# Patient Record
Sex: Female | Born: 1955 | Race: White | Hispanic: No | Marital: Married | Smoking: Current some day smoker
Health system: Southern US, Community
[De-identification: ages and names within clinical notes are randomized; demographics above are authoritative.]

## PROBLEM LIST (undated history)

## (undated) DIAGNOSIS — E7800 Pure hypercholesterolemia, unspecified: Secondary | ICD-10-CM

## (undated) DIAGNOSIS — I10 Essential (primary) hypertension: Secondary | ICD-10-CM

## (undated) DIAGNOSIS — M1990 Unspecified osteoarthritis, unspecified site: Secondary | ICD-10-CM

## (undated) DIAGNOSIS — G43909 Migraine, unspecified, not intractable, without status migrainosus: Secondary | ICD-10-CM

## (undated) DIAGNOSIS — M5430 Sciatica, unspecified side: Secondary | ICD-10-CM

## (undated) DIAGNOSIS — H5210 Myopia, unspecified eye: Secondary | ICD-10-CM

## (undated) DIAGNOSIS — H43813 Vitreous degeneration, bilateral: Secondary | ICD-10-CM

## (undated) DIAGNOSIS — M8580 Other specified disorders of bone density and structure, unspecified site: Secondary | ICD-10-CM

## (undated) DIAGNOSIS — F329 Major depressive disorder, single episode, unspecified: Secondary | ICD-10-CM

## (undated) DIAGNOSIS — F32A Depression, unspecified: Secondary | ICD-10-CM

## (undated) DIAGNOSIS — K219 Gastro-esophageal reflux disease without esophagitis: Secondary | ICD-10-CM

## (undated) DIAGNOSIS — M549 Dorsalgia, unspecified: Secondary | ICD-10-CM

## (undated) DIAGNOSIS — J449 Chronic obstructive pulmonary disease, unspecified: Secondary | ICD-10-CM

## (undated) DIAGNOSIS — M810 Age-related osteoporosis without current pathological fracture: Secondary | ICD-10-CM

## (undated) DIAGNOSIS — G8929 Other chronic pain: Secondary | ICD-10-CM

## (undated) DIAGNOSIS — F419 Anxiety disorder, unspecified: Secondary | ICD-10-CM

## (undated) HISTORY — PX: FRACTURE SURGERY: SHX138

## (undated) HISTORY — PX: KNEE SURGERY: SHX244

## (undated) HISTORY — PX: SHOULDER ARTHROSCOPY W/ ROTATOR CUFF REPAIR: SHX2400

## (undated) HISTORY — PX: OTHER SURGICAL HISTORY: SHX169

---

## 2014-03-24 ENCOUNTER — Emergency Department (HOSPITAL_COMMUNITY)
Admission: EM | Admit: 2014-03-24 | Discharge: 2014-03-25 | Disposition: A | Discharge status: Home / Self Care | Payer: Medicaid Other | Attending: Emergency Medicine | Admitting: Emergency Medicine

## 2014-03-24 ENCOUNTER — Encounter (HOSPITAL_COMMUNITY): Payer: Self-pay | Admitting: *Deleted

## 2014-03-24 DIAGNOSIS — W01198A Fall on same level from slipping, tripping and stumbling with subsequent striking against other object, initial encounter: Secondary | ICD-10-CM | POA: Insufficient documentation

## 2014-03-24 DIAGNOSIS — W108XXA Fall (on) (from) other stairs and steps, initial encounter: Secondary | ICD-10-CM | POA: Insufficient documentation

## 2014-03-24 DIAGNOSIS — Y9301 Activity, walking, marching and hiking: Secondary | ICD-10-CM | POA: Diagnosis not present

## 2014-03-24 DIAGNOSIS — W19XXXA Unspecified fall, initial encounter: Secondary | ICD-10-CM

## 2014-03-24 DIAGNOSIS — Y9289 Other specified places as the place of occurrence of the external cause: Secondary | ICD-10-CM | POA: Insufficient documentation

## 2014-03-24 DIAGNOSIS — Z880 Allergy status to penicillin: Secondary | ICD-10-CM | POA: Insufficient documentation

## 2014-03-24 DIAGNOSIS — S00212A Abrasion of left eyelid and periocular area, initial encounter: Secondary | ICD-10-CM | POA: Insufficient documentation

## 2014-03-24 DIAGNOSIS — Y998 Other external cause status: Secondary | ICD-10-CM | POA: Insufficient documentation

## 2014-03-24 DIAGNOSIS — Z7951 Long term (current) use of inhaled steroids: Secondary | ICD-10-CM | POA: Diagnosis not present

## 2014-03-24 DIAGNOSIS — Z79899 Other long term (current) drug therapy: Secondary | ICD-10-CM | POA: Diagnosis not present

## 2014-03-24 DIAGNOSIS — S060X1A Concussion with loss of consciousness of 30 minutes or less, initial encounter: Secondary | ICD-10-CM | POA: Insufficient documentation

## 2014-03-24 DIAGNOSIS — S0990XA Unspecified injury of head, initial encounter: Secondary | ICD-10-CM | POA: Diagnosis present

## 2014-03-24 DIAGNOSIS — I10 Essential (primary) hypertension: Secondary | ICD-10-CM | POA: Insufficient documentation

## 2014-03-24 DIAGNOSIS — Z720 Tobacco use: Secondary | ICD-10-CM | POA: Diagnosis not present

## 2014-03-24 HISTORY — DX: Essential (primary) hypertension: I10

## 2014-03-24 NOTE — ED Notes (Signed)
Pt reports she fell while going down the stairs-fell 15 steps.  Pt reports head, neck, knees, elbows and wrists pain.  +LOC.  At first denies LOC, but family at bedside reports LOC.  Abrasion noted on L side of her face.

## 2014-03-25 ENCOUNTER — Emergency Department (HOSPITAL_COMMUNITY): Payer: Medicaid Other

## 2014-03-25 MED ORDER — ACETAMINOPHEN 500 MG PO TABS
1000.0000 mg | ORAL_TABLET | Freq: Once | ORAL | Status: AC
  Administered 2014-03-25: 1000 mg via ORAL
  Filled 2014-03-25: qty 2

## 2014-03-25 MED ORDER — METOCLOPRAMIDE HCL 10 MG PO TABS
10.0000 mg | ORAL_TABLET | Freq: Once | ORAL | Status: AC
  Administered 2014-03-25: 10 mg via ORAL
  Filled 2014-03-25: qty 1

## 2014-03-25 NOTE — Discharge Instructions (Signed)
Fall Prevention and Home Safety Dana Powers, your CT scan di not show any major injuries. Follow-up with a primary care physician within 3 days for continued management. If symptoms worsen come back to emergency department and medially. Thank you. Falls cause injuries and can affect all age groups. It is possible to prevent falls.  HOW TO PREVENT FALLS  Wear shoes with rubber soles that do not have an opening for your toes.  Keep the inside and outside of your house well lit.  Use night lights throughout your home.  Remove clutter from floors.  Clean up floor spills.  Remove throw rugs or fasten them to the floor with carpet tape.  Do not place electrical cords across pathways.  Put grab bars by your tub, shower, and toilet. Do not use towel bars as grab bars.  Put handrails on both sides of the stairway. Fix loose handrails.  Do not climb on stools or stepladders, if possible.  Do not wax your floors.  Repair uneven or unsafe sidewalks, walkways, or stairs.  Keep items you use a lot within reach.  Be aware of pets.  Keep emergency numbers next to the telephone.  Put smoke detectors in your home and near bedrooms. Ask your doctor what other things you can do to prevent falls. Document Released: 11/06/2008 Document Revised: 07/12/2011 Document Reviewed: 04/12/2011 [facility] Patient Information 2015 [place], [place]. This information is not intended to replace advice given to you by your health care provider. Make sure you discuss any questions you have with your health care provider.  Concussion A concussion is a brain injury. It is caused by:  A hit to the head.  A quick and sudden movement (jolt) of the head or neck. A concussion is usually not life threatening. Even so, it can cause serious problems. If you had a concussion before, you may have concussion-like problems after a hit to your head. HOME CARE General Instructions  Follow your doctor's directions  carefully.  Take medicines only as told by your doctor.  Only take medicines your doctor says are safe.  Do not drink alcohol until your doctor says it is okay. Alcohol and some drugs can slow down healing. They can also put you at risk for further injury.  If you are having trouble remembering things, write them down.  Try to do one thing at a time if you get distracted easily. For example, do not watch TV while making dinner.  Talk to your family members or close friends when making important decisions.  Follow up with your doctor as told.  Watch your symptoms. Tell others to do the same. Serious problems can sometimes happen after a concussion. Older adults are more likely to have these problems.  Tell your teachers, school nurse, school counselor, coach, Event organiserathletic trainer, or work Production designer, theatre/television/filmmanager about your concussion. Tell them about what you can or cannot do. They should watch to see if:  It gets even harder for you to pay attention or concentrate.  It gets even harder for you to remember things or learn new things.  You need more time than normal to finish things.  You become annoyed (irritable) more than before.  You are not able to deal with stress as well.  You have more problems than before.  Rest. Make sure you:  Get plenty of sleep at night.  Go to sleep early.  Go to bed at the same time every day. Try to wake up at the same time.  Rest  during the day.  Take naps when you feel tired.  Limit activities where you have to think a lot or concentrate. These include:  Doing homework.  Doing work related to a job.  Watching TV.  Using the computer. Returning To Your Regular Activities Return to your normal activities slowly, not all at once. You must give your body and brain enough time to heal.   Do not play sports or do other athletic activities until your doctor says it is okay.  Ask your doctor when you can drive, ride a bicycle, or work other vehicles or  machines. Never do these things if you feel dizzy.  Ask your doctor about when you can return to work or school. Preventing Another Concussion It is very important to avoid another brain injury, especially before you have healed. In rare cases, another injury can lead to permanent brain damage, brain swelling, or death. The risk of this is greatest during the first 7-10 days after your injury. Avoid injuries by:   Wearing a seat belt when riding in a car.  Not drinking too much alcohol.  Avoiding activities that could lead to a second concussion (such as contact sports).  Wearing a helmet when doing activities like:  Biking.  Skiing.  Skateboarding.  Skating.  Making your home safer by:  Removing things from the floor or stairways that could make you trip.  Using grab bars in bathrooms and handrails by stairs.  Placing non-slip mats on floors and in bathtubs.  Improve lighting in dark areas. GET HELP IF:  It gets even harder for you to pay attention or concentrate.  It gets even harder for you to remember things or learn new things.  You need more time than normal to finish things.  You become annoyed (irritable) more than before.  You are not able to deal with stress as well.  You have more problems than before.  You have problems keeping your balance.  You are not able to react quickly when you should. Get help if you have any of these problems for more than 2 weeks:   Lasting (chronic) headaches.  Dizziness or trouble balancing.  Feeling sick to your stomach (nausea).  Seeing (vision) problems.  Being affected by noises or light more than normal.  Feeling sad, low, down in the dumps, blue, gloomy, or empty (depressed).  Mood changes (mood swings).  Feeling of fear or nervousness about what may happen (anxiety).  Feeling annoyed.  Memory problems.  Problems concentrating or paying attention.  Sleep problems.  Feeling tired all the time. GET  HELP RIGHT AWAY IF:   You have bad headaches or your headaches get worse.  You have weakness (even if it is in one hand, leg, or part of the face).  You have loss of feeling (numbness).  You feel off balance.  You keep throwing up (vomiting).  You feel tired.  One black center of your eye (pupil) is larger than the other.  You twitch or shake violently (convulse).  Your speech is not clear (slurred).  You are more confused, easily angered (agitated), or annoyed than before.  You have more trouble resting than before.  You are unable to recognize people or places.  You have neck pain.  It is difficult to wake you up.  You have unusual behavior changes.  You pass out (lose consciousness). MAKE SURE YOU:   Understand these instructions.  Will watch your condition.  Will get help right away if you  are not doing well or get worse. Document Released: 12/29/2008 Document Revised: 05/27/2013 Document Reviewed: 08/02/2012 [facility] Patient Information 2015 [place], [place]. This information is not intended to replace advice given to you by your health care provider. Make sure you discuss any questions you have with your health care provider.

## 2014-03-25 NOTE — ED Provider Notes (Signed)
CSN: [id number]     Arrival date & time 03/24/14  2323 History   First MD Initiated Contact with Patient 03/25/14 0126     Chief Complaint  Patient presents with  . Fall  . Neck Injury     (Consider location/radiation/quality/duration/timing/severity/associated sxs/prior Treatment) HPI  Dana Powers is a 59 y.o. female with past medical history of hypertension presenting today after a fall. Patient states this occurred 35 minutes prior to arrival. She was walking down the steps and the railing slipped, she fell face forward, hit her head and has LOC.  She states this was a mechanical fall and denies any prodromal symptoms.  She is currently complaining of pain on her forehead, left infraorbital area, and paraspinal C-spine area. She has not taken anything for pain currently, patient has no further complaints.  10 Systems reviewed and are negative for acute change except as noted in the HPI.    Past Medical History  Diagnosis Date  . Hypertension    Past Surgical History  Procedure Laterality Date  . Neck fusion    . Shoulder arthroscopy w/ rotator cuff repair    . Fracture surgery      t11  . Degenerative disc disease     No family history on file. History  Substance Use Topics  . Smoking status: Current Every Day Smoker  . Smokeless tobacco: Not on file  . Alcohol Use: No   OB History    No data available     Review of Systems    Allergies  Robaxin; Crab; Penicillins; Sulfa antibiotics; Toradol; and Tramadol  Home Medications   Prior to Admission medications   Medication Sig Start Date End Date Taking? Authorizing Provider  albuterol (PROVENTIL HFA;VENTOLIN HFA) 108 (90 BASE) MCG/ACT inhaler Inhale 1 puff into the lungs every 6 (six) hours as needed for wheezing or shortness of breath.   Yes Historical Provider, MD  ALPRAZolam Prudy Feeler) 1 MG tablet Take 1 mg by mouth 3 (three) times daily as needed for anxiety.   Yes Historical Provider, MD  atomoxetine (STRATTERA)  60 MG capsule Take 60 mg by mouth daily.   Yes Historical Provider, MD  beclomethasone (QVAR) 80 MCG/ACT inhaler Inhale 1 puff into the lungs 2 (two) times daily.   Yes Historical Provider, MD  cyclobenzaprine (FLEXERIL) 10 MG tablet Take 10 mg by mouth 3 (three) times daily as needed for muscle spasms.   Yes Historical Provider, MD  ezetimibe (ZETIA) 10 MG tablet Take 10 mg by mouth daily.   Yes Historical Provider, MD  HYDROcodone-acetaminophen (NORCO) 10-325 MG per tablet Take 1 tablet by mouth every 6 (six) hours as needed.   Yes Historical Provider, MD  metoprolol (LOPRESSOR) 50 MG tablet Take 50 mg by mouth 2 (two) times daily.   Yes Historical Provider, MD  nicotine (NICODERM CQ - DOSED IN MG/24 HOURS) 14 mg/24hr patch Place 14 mg onto the skin daily.   Yes Historical Provider, MD  ranitidine (ZANTAC) 300 MG tablet Take 300 mg by mouth at bedtime.   Yes Historical Provider, MD  sertraline (ZOLOFT) 100 MG tablet Take 100 mg by mouth daily.   Yes Historical Provider, MD  simvastatin (ZOCOR) 40 MG tablet Take 40 mg by mouth daily.   Yes Historical Provider, MD   BP 135/72 mmHg  Pulse 87  Temp(Src) 98.2 F (36.8 C) (Oral)  Resp 16  Ht  (1.626 m)  Wt 141 lb (63.957 kg)  BMI 24.19 kg/m2  SpO2 100%  Physical Exam  Constitutional: She is oriented to person, place, and time. She appears well-developed and well-nourished. No distress.  HENT:  Head: Normocephalic and atraumatic.  Nose: Nose normal.  Mouth/Throat: Oropharynx is clear and moist. No oropharyngeal exudate.  Linear abrasion to left infraorbital area.  Eyes: Conjunctivae and EOM are normal. Pupils are equal, round, and reactive to light. No scleral icterus.  Neck: Normal range of motion. Neck supple. No JVD present. No tracheal deviation present. No thyromegaly present.  No C-spine tenderness, tenderness to palpation of paraspinal muscles.  Cardiovascular: Normal rate, regular rhythm and normal heart sounds.  Exam reveals no  gallop and no friction rub.   No murmur heard. Pulmonary/Chest: Effort normal and breath sounds normal. No respiratory distress. She has no wheezes. She exhibits no tenderness.  Abdominal: Soft. Bowel sounds are normal. She exhibits no distension and no mass. There is no tenderness. There is no rebound and no guarding.  Musculoskeletal: Normal range of motion. She exhibits no edema or tenderness.  Lymphadenopathy:    She has no cervical adenopathy.  Neurological: She is alert and oriented to person, place, and time. No cranial nerve deficit. She exhibits normal muscle tone.  Normal strength and sensation 4 extremities, normal cerebellar testing  Skin: Skin is warm and dry. No rash noted. No erythema. No pallor.  Nursing note and vitals reviewed.   ED Course  Procedures (including critical care time) Labs Review Labs Reviewed - No data to display  Imaging Review Ct Head Wo Contrast  03/25/2014   CLINICAL DATA:  Status post fall going down stairs. Fell down 15 steps. Head pain, left-sided facial abrasion and loss of consciousness. Initial encounter.  EXAM: CT HEAD WITHOUT CONTRAST  CT MAXILLOFACIAL WITHOUT CONTRAST  TECHNIQUE: Multidetector CT imaging of the head and maxillofacial structures were performed using the standard protocol without intravenous contrast. Multiplanar CT image reconstructions of the maxillofacial structures were also generated.  COMPARISON:  None.  FINDINGS: CT HEAD FINDINGS  There is no evidence of acute infarction, mass lesion, or intra- or extra-axial hemorrhage on CT.  The posterior fossa, including the cerebellum, brainstem and fourth ventricle, is within normal limits. The third and lateral ventricles, and basal ganglia are unremarkable in appearance. The cerebral hemispheres are symmetric in appearance, with normal gray-white differentiation. No mass effect or midline shift is seen.  There is no evidence of fracture; visualized osseous structures are unremarkable in  appearance. The orbits are within normal limits. The paranasal sinuses and mastoid air cells are well-aerated. No significant soft tissue abnormalities are seen.  CT MAXILLOFACIAL FINDINGS  There is no evidence of fracture or dislocation. The maxilla and mandible appear intact. The nasal bone is unremarkable in appearance. There is chronic absence of the dentition. Anterior cervical spinal fusion hardware is partially imaged, extending inferiorly from C5.  The orbits are intact bilaterally. The visualized paranasal sinuses and mastoid air cells are well-aerated.  No significant soft tissue abnormalities are seen. The known soft tissue abrasion along the left side of the face is not well characterized on CT. The parapharyngeal fat planes are preserved. The nasopharynx, oropharynx and hypopharynx are unremarkable in appearance. The visualized portions of the valleculae and piriform sinuses are grossly unremarkable.  The parotid and submandibular glands are within normal limits. No cervical lymphadenopathy is seen.  IMPRESSION: 1. No evidence of traumatic intracranial injury or fracture. 2. No evidence of fracture or dislocation with regard to the maxillofacial structures.   Electronically Signed   By: Leotis ShamesJeffery  Chang M.D.   On: 03/25/2014 03:38   Ct Maxillofacial Wo Cm  03/25/2014   CLINICAL DATA:  Status post fall going down stairs. Fell down 15 steps. Head pain, left-sided facial abrasion and loss of consciousness. Initial encounter.  EXAM: CT HEAD WITHOUT CONTRAST  CT MAXILLOFACIAL WITHOUT CONTRAST  TECHNIQUE: Multidetector CT imaging of the head and maxillofacial structures were performed using the standard protocol without intravenous contrast. Multiplanar CT image reconstructions of the maxillofacial structures were also generated.  COMPARISON:  None.  FINDINGS: CT HEAD FINDINGS  There is no evidence of acute infarction, mass lesion, or intra- or extra-axial hemorrhage on CT.  The posterior fossa, including the  cerebellum, brainstem and fourth ventricle, is within normal limits. The third and lateral ventricles, and basal ganglia are unremarkable in appearance. The cerebral hemispheres are symmetric in appearance, with normal gray-white differentiation. No mass effect or midline shift is seen.  There is no evidence of fracture; visualized osseous structures are unremarkable in appearance. The orbits are within normal limits. The paranasal sinuses and mastoid air cells are well-aerated. No significant soft tissue abnormalities are seen.  CT MAXILLOFACIAL FINDINGS  There is no evidence of fracture or dislocation. The maxilla and mandible appear intact. The nasal bone is unremarkable in appearance. There is chronic absence of the dentition. Anterior cervical spinal fusion hardware is partially imaged, extending inferiorly from C5.  The orbits are intact bilaterally. The visualized paranasal sinuses and mastoid air cells are well-aerated.  No significant soft tissue abnormalities are seen. The known soft tissue abrasion along the left side of the face is not well characterized on CT. The parapharyngeal fat planes are preserved. The nasopharynx, oropharynx and hypopharynx are unremarkable in appearance. The visualized portions of the valleculae and piriform sinuses are grossly unremarkable.  The parotid and submandibular glands are within normal limits. No cervical lymphadenopathy is seen.  IMPRESSION: 1. No evidence of traumatic intracranial injury or fracture. 2. No evidence of fracture or dislocation with regard to the maxillofacial structures.   Electronically Signed   By: Roanna Raider M.D.   On: 03/25/2014 03:38     EKG Interpretation None      MDM   Final diagnoses:  Fall    Patient presents to emergency department after a fall, she admits to loss of consciousness. Will evaluate with CT scan of head and face to assess for injury. Patient was given Tylenol Reglan for headache.  CT scan does not show any  acute injury.  Patients vital signs remain within her normal limits and she is safe for DC with PCP fu within 3days. Concussion education given, patient advised to take Tylenol Motrin as needed for pain.    Tomasita Crumble, MD 03/25/14 [phone]

## 2015-03-06 ENCOUNTER — Emergency Department (HOSPITAL_COMMUNITY): Payer: Medicaid Other

## 2015-03-06 ENCOUNTER — Encounter (HOSPITAL_COMMUNITY): Payer: Self-pay

## 2015-03-06 ENCOUNTER — Emergency Department (HOSPITAL_COMMUNITY)
Admission: EM | Admit: 2015-03-06 | Discharge: 2015-03-07 | Disposition: A | Discharge status: Home / Self Care | Payer: Medicaid Other | Attending: Emergency Medicine | Admitting: Emergency Medicine

## 2015-03-06 DIAGNOSIS — G43909 Migraine, unspecified, not intractable, without status migrainosus: Secondary | ICD-10-CM | POA: Insufficient documentation

## 2015-03-06 DIAGNOSIS — F410 Panic disorder [episodic paroxysmal anxiety] without agoraphobia: Secondary | ICD-10-CM | POA: Diagnosis not present

## 2015-03-06 DIAGNOSIS — F329 Major depressive disorder, single episode, unspecified: Secondary | ICD-10-CM | POA: Diagnosis not present

## 2015-03-06 DIAGNOSIS — M79622 Pain in left upper arm: Secondary | ICD-10-CM | POA: Diagnosis not present

## 2015-03-06 DIAGNOSIS — J441 Chronic obstructive pulmonary disease with (acute) exacerbation: Secondary | ICD-10-CM | POA: Insufficient documentation

## 2015-03-06 DIAGNOSIS — I10 Essential (primary) hypertension: Secondary | ICD-10-CM | POA: Diagnosis present

## 2015-03-06 DIAGNOSIS — G8929 Other chronic pain: Secondary | ICD-10-CM | POA: Diagnosis not present

## 2015-03-06 DIAGNOSIS — Z79899 Other long term (current) drug therapy: Secondary | ICD-10-CM | POA: Insufficient documentation

## 2015-03-06 DIAGNOSIS — K219 Gastro-esophageal reflux disease without esophagitis: Secondary | ICD-10-CM | POA: Diagnosis not present

## 2015-03-06 DIAGNOSIS — Z791 Long term (current) use of non-steroidal anti-inflammatories (NSAID): Secondary | ICD-10-CM | POA: Diagnosis not present

## 2015-03-06 DIAGNOSIS — E7800 Pure hypercholesterolemia, unspecified: Secondary | ICD-10-CM | POA: Insufficient documentation

## 2015-03-06 DIAGNOSIS — M79661 Pain in right lower leg: Secondary | ICD-10-CM | POA: Diagnosis not present

## 2015-03-06 DIAGNOSIS — M79662 Pain in left lower leg: Secondary | ICD-10-CM | POA: Insufficient documentation

## 2015-03-06 DIAGNOSIS — M1990 Unspecified osteoarthritis, unspecified site: Secondary | ICD-10-CM | POA: Diagnosis not present

## 2015-03-06 DIAGNOSIS — Z880 Allergy status to penicillin: Secondary | ICD-10-CM | POA: Insufficient documentation

## 2015-03-06 DIAGNOSIS — F419 Anxiety disorder, unspecified: Secondary | ICD-10-CM

## 2015-03-06 DIAGNOSIS — F17210 Nicotine dependence, cigarettes, uncomplicated: Secondary | ICD-10-CM | POA: Diagnosis not present

## 2015-03-06 HISTORY — DX: Other chronic pain: G89.29

## 2015-03-06 HISTORY — DX: Gastro-esophageal reflux disease without esophagitis: K21.9

## 2015-03-06 HISTORY — DX: Migraine, unspecified, not intractable, without status migrainosus: G43.909

## 2015-03-06 HISTORY — DX: Unspecified osteoarthritis, unspecified site: M19.90

## 2015-03-06 HISTORY — DX: Myopia, unspecified eye: H52.10

## 2015-03-06 HISTORY — DX: Depression, unspecified: F32.A

## 2015-03-06 HISTORY — DX: Other specified disorders of bone density and structure, unspecified site: M85.80

## 2015-03-06 HISTORY — DX: Sciatica, unspecified side: M54.30

## 2015-03-06 HISTORY — DX: Dorsalgia, unspecified: M54.9

## 2015-03-06 HISTORY — DX: Chronic obstructive pulmonary disease, unspecified: J44.9

## 2015-03-06 HISTORY — DX: Age-related osteoporosis without current pathological fracture: M81.0

## 2015-03-06 HISTORY — DX: Major depressive disorder, single episode, unspecified: F32.9

## 2015-03-06 HISTORY — DX: Anxiety disorder, unspecified: F41.9

## 2015-03-06 HISTORY — DX: Pure hypercholesterolemia, unspecified: E78.00

## 2015-03-06 HISTORY — DX: Vitreous degeneration, bilateral: H43.813

## 2015-03-06 LAB — CBC
HEMATOCRIT: 42.5 % (ref 36.0–46.0)
Hemoglobin: 14.4 g/dL (ref 12.0–15.0)
MCH: 30.4 pg (ref 26.0–34.0)
MCHC: 33.9 g/dL (ref 30.0–36.0)
MCV: 89.7 fL (ref 78.0–100.0)
Platelets: 294 10*3/uL (ref 150–400)
RBC: 4.74 MIL/uL (ref 3.87–5.11)
RDW: 12.8 % (ref 11.5–15.5)
WBC: 16 10*3/uL — ABNORMAL HIGH (ref 4.0–10.5)

## 2015-03-06 LAB — BASIC METABOLIC PANEL
ANION GAP: 10 (ref 5–15)
BUN: 10 mg/dL (ref 6–20)
CHLORIDE: 101 mmol/L (ref 101–111)
CO2: 27 mmol/L (ref 22–32)
Calcium: 9.3 mg/dL (ref 8.9–10.3)
Creatinine, Ser: 0.87 mg/dL (ref 0.44–1.00)
GFR calc Af Amer: >60 mL/min (ref >60)
GFR calc non Af Amer: >60 mL/min (ref >60)
Glucose, Bld: 104 mg/dL — ABNORMAL HIGH (ref 65–99)
POTASSIUM: 4.1 mmol/L (ref 3.5–5.1)
SODIUM: 138 mmol/L (ref 135–145)

## 2015-03-06 LAB — I-STAT TROPONIN, ED: Troponin i, poc: 0.01 ng/mL (ref 0.00–0.08)

## 2015-03-06 NOTE — Progress Notes (Addendum)
Pt medicaid provider confirmed with the office on 03/06/15 as Stoney Bang, PA-C [address] [place] [phone]   Entered in d/c instructions  Stoney Bang Schedule an appointment as soon as possible for a visit on 03/09/2015 This is your assigned Medicaid [place] access doctor If you prefer another contact DSS [phone] DSS assigned your doctor *You may receive a bill if you go to any family Dr not assigned to you [address] [place] [address] [id number] Medicaid [place] Access Covered Patient [place]: [phone] [address] [place], [place] [address] [email or web address] Use this website to assist with understanding your coverage & to renew application As a Medicaid client you MUST contact DSS/SSI each time you change address, move to another [place] county or another state to keep your address updated [place] Medicaid Transportation to Dr appts if you are have full Medicaid: [phone], [phone]

## 2015-03-06 NOTE — ED Provider Notes (Signed)
CSN: [id number]     Arrival date & time 03/06/15  1637 History  By signing my name below, I, Lyndel Safe, attest that this documentation has been prepared under the direction and in the presence of Rolan Bucco, MD. Electronically Signed: Lyndel Safe, ED Scribe. 03/06/2015. 12:07 AM.   Chief Complaint  Patient presents with  . Arm Pain  . Leg Pain  . Hypertension  . Chest Pain    The history is provided by the patient. No language interpreter was used.   HPI Comments: Camren Henthorn is a 60 y.o. female, with a h/o HTN, osteoarthritis, depression, anxiety, and panic attacks, who presents to the Emergency Department complaining of constant, moderate pain to bilateral anterior lower legs that she describes as a soreness onset today. This pain is worse with flexion of feet and ambulating, and pt states she has been walking more than usual over the past week. Pt associates intermittent blurry vision and light-headedness onset today, following a panic attack occurring 1 day ago.   She reports experiencing a severe panic attack yesterday which she notes was significant for bilateral mandible pain, trouble breathing, left upper arm pain, numbness in bilateral hands, and GERD, which is not characteristic of her previous panic attacks. She states during the panic attack she was hypertensive at 198/105 mmHg and tachycardic at 102 bpm. The symptoms of SOB, jaw pain, and GERD have since resolved but pt notes she has been hypertensive and tachycardic throughout the day today. She is prescribed lopressor BID and was unable to take her evening dose tonight. The pt admits to being under increased stress lately due to family verbal altercations and her mother recently being admitted to the hospital. She denies numbness in extremities today, or any chest pain over the course of her symptoms. No other associated symptoms or recent illness. She received a stress test 1 year ago at [facility]; she is  unable to recall the results of this study.   PCP; Stoney Bang, PA-C   Past Medical History  Diagnosis Date  . Hypertension   . Osteoarthritis   . Chronic back pain   . Sciatica   . High cholesterol   . Depression   . Anxiety   . Acid reflux   . COPD (chronic obstructive pulmonary disease) (HCC)   . Migraine   . Osteoporosis   . Osteopenia   . Myopia   . PVD (posterior vitreous detachment), both eyes    Past Surgical History  Procedure Laterality Date  . Neck fusion    . Shoulder arthroscopy w/ rotator cuff repair    . Fracture surgery      t11  . Degenerative disc disease    . Knee surgery     Family History  Problem Relation Age of Onset  . Heart failure Father    Social History  Substance Use Topics  . Smoking status: Current Some Day Smoker -- 0.25 packs/day    Types: Cigarettes  . Smokeless tobacco: Never Used  . Alcohol Use: No   OB History    No data available     Review of Systems  Constitutional: Negative for fever, chills, diaphoresis and fatigue.  HENT: Negative for congestion, rhinorrhea and sneezing.   Eyes: Positive for visual disturbance ( blurry vision).  Respiratory: Negative for cough, chest tightness and shortness of breath.   Cardiovascular: Negative for chest pain and leg swelling.  Gastrointestinal: Negative for nausea, vomiting, abdominal pain, diarrhea and blood in stool.  Genitourinary: Negative for frequency, hematuria, flank pain and difficulty urinating.  Musculoskeletal: Positive for arthralgias ( bilateral anterior lower legs). Negative for back pain and gait problem.  Skin: Negative for rash.  Neurological: Positive for light-headedness. Negative for dizziness, speech difficulty, weakness, numbness and headaches.   Allergies  Robaxin; Crab; Penicillins; Sulfa antibiotics; Toradol; and Tramadol  Home Medications   Prior to Admission medications   Medication Sig Start Date End Date Taking? Authorizing Provider  albuterol  (PROVENTIL HFA;VENTOLIN HFA) 108 (90 BASE) MCG/ACT inhaler Inhale 1 puff into the lungs every 6 (six) hours as needed for wheezing or shortness of breath.   Yes Historical Provider, MD  ALPRAZolam Prudy Feeler) 1 MG tablet Take 1 mg by mouth 3 (three) times daily as needed for anxiety.   Yes Historical Provider, MD  Ascorbic Acid (VITAMIN C) 1000 MG tablet Take 1,000 mg by mouth daily.    Yes Historical Provider, MD  Azelastine HCl 0.15 % SOLN Place 1 spray into the nose daily.    Yes Historical Provider, MD  beclomethasone (QVAR) 80 MCG/ACT inhaler Inhale 1 puff into the lungs 2 (two) times daily.   Yes Historical Provider, MD  Cholecalciferol (VITAMIN D-1000 MAX ST) 1000 units tablet Take 1,000 Units by mouth daily.    Yes Historical Provider, MD  cyclobenzaprine (FLEXERIL) 10 MG tablet Take 10 mg by mouth 3 (three) times daily as needed for muscle spasms.   Yes Historical Provider, MD  ezetimibe (ZETIA) 10 MG tablet Take 10 mg by mouth daily.   Yes Historical Provider, MD  meloxicam (MOBIC) 7.5 MG tablet TAKE 1 TABLET (7.5 MG TOTAL) BY MOUTH 2 TIMES DAILY. 02/27/15  Yes Historical Provider, MD  metoprolol (LOPRESSOR) 50 MG tablet Take 50 mg by mouth 2 (two) times daily.   Yes Historical Provider, MD  nicotine (NICODERM CQ - DOSED IN MG/24 HOURS) 14 mg/24hr patch Place 14 mg onto the skin daily.   Yes Historical Provider, MD  omega-3 acid ethyl esters (LOVAZA) 1 g capsule Take 1 g by mouth daily.   Yes Historical Provider, MD  pantoprazole (PROTONIX) 40 MG tablet TAKE 1 TABLET BY MOUTH EVERY MORNING BEFORE BREAKFAST 02/27/15  Yes Historical Provider, MD  ranitidine (ZANTAC) 300 MG tablet Take 300 mg by mouth at bedtime.   Yes Historical Provider, MD  sertraline (ZOLOFT) 100 MG tablet Take 25 mg by mouth daily.    Yes Historical Provider, MD  simvastatin (ZOCOR) 40 MG tablet Take 40 mg by mouth daily.   Yes Historical Provider, MD  STRATTERA 25 MG capsule Take 25 mg by mouth daily. 02/27/15  Yes Historical  Provider, MD  SUBOXONE 8-2 MG FILM Place 1 strip under the tongue twice daily 02/28/15  Yes Historical Provider, MD  tiotropium (SPIRIVA) 18 MCG inhalation capsule Place 18 mcg into inhaler and inhale daily.    Yes Historical Provider, MD  vitamin E 100 UNIT capsule Take 100 Units by mouth daily.    Yes Historical Provider, MD   BP 159/95 mmHg  Pulse 95  Temp(Src) 98.5 F (36.9 C) (Oral)  Resp 20  SpO2 96% Physical Exam  Constitutional: She is oriented to person, place, and time. She appears well-developed and well-nourished.  HENT:  Head: Normocephalic and atraumatic.  Eyes: Pupils are equal, round, and reactive to light.  Neck: Normal range of motion. Neck supple.  Cardiovascular: Normal rate, regular rhythm and normal heart sounds.   Pulmonary/Chest: Effort normal and breath sounds normal. No respiratory distress. She has no  wheezes. She has no rales. She exhibits no tenderness.  Abdominal: Soft. Bowel sounds are normal. There is no tenderness. There is no rebound and no guarding.  Musculoskeletal: Normal range of motion. She exhibits no edema.  Tenderness to anterior lower leg muscles, no pain to posterior calf, no swelling or discoloration, pedal pulses intact.   Lymphadenopathy:    She has no cervical adenopathy.  Neurological: She is alert and oriented to person, place, and time.  Motor 5/5 in all extremities, sensation grossly intact to light touch in all extremities, finger-nose intact, no pronator drift, cranial nerves intact.   Skin: Skin is warm and dry. No rash noted.  Psychiatric: She has a normal mood and affect.    ED Course  Procedures  DIAGNOSTIC STUDIES: Oxygen Saturation is 96% on RA, adequate by my interpretation.    COORDINATION OF CARE: 11:52 PM Discussed treatment plan with pt at bedside and pt agreed to plan. Discussed unremarkable workup with pt and plan to discharge. She has metoprolol at home and close follow up with her PCP. Pt is agreeable to plan.     Labs Review Results for orders placed or performed during the hospital encounter of 03/06/15  Basic metabolic panel  Result Value Ref Range   Sodium 138 135 - 145 mmol/L   Potassium 4.1 3.5 - 5.1 mmol/L   Chloride 101 101 - 111 mmol/L   CO2 27 22 - 32 mmol/L   Glucose, Bld 104 (H) 65 - 99 mg/dL   BUN 10 6 - 20 mg/dL   Creatinine, Ser 1.61 0.44 - 1.00 mg/dL   Calcium 9.3 8.9 - 09.6 mg/dL   GFR calc non Af Amer >60 >60 mL/min   GFR calc Af Amer >60 >60 mL/min   Anion gap 10 5 - 15  CBC  Result Value Ref Range   WBC 16.0 (H) 4.0 - 10.5 K/uL   RBC 4.74 3.87 - 5.11 MIL/uL   Hemoglobin 14.4 12.0 - 15.0 g/dL   HCT 04.5 40.9 - 81.1 %   MCV 89.7 78.0 - 100.0 fL   MCH 30.4 26.0 - 34.0 pg   MCHC 33.9 30.0 - 36.0 g/dL   RDW 91.4 78.2 - 95.6 %   Platelets 294 150 - 400 K/uL  I-stat troponin, ED (not at [facility], [facility])  Result Value Ref Range   Troponin i, poc 0.01 0.00 - 0.08 ng/mL   Comment 3           Dg Chest 2 View  03/06/2015  CLINICAL DATA:  60 year old female with left arm pain and numbness for 1 day. EXAM: CHEST  2 VIEW COMPARISON:  None. FINDINGS: The cardiomediastinal silhouette is unremarkable. There is no evidence of focal airspace disease, pulmonary edema, suspicious pulmonary nodule/mass, pleural effusion, or pneumothorax. No acute bony abnormalities are identified. Cervical spine surgical hardware noted. IMPRESSION: No active cardiopulmonary disease. Electronically Signed   By: Harmon Pier M.D.   On: 03/06/2015 18:23      Imaging Review Dg Chest 2 View  03/06/2015  CLINICAL DATA:  60 year old female with left arm pain and numbness for 1 day. EXAM: CHEST  2 VIEW COMPARISON:  None. FINDINGS: The cardiomediastinal silhouette is unremarkable. There is no evidence of focal airspace disease, pulmonary edema, suspicious pulmonary nodule/mass, pleural effusion, or pneumothorax. No acute bony abnormalities are identified. Cervical spine surgical hardware noted. IMPRESSION: No active  cardiopulmonary disease. Electronically Signed   By: Harmon Pier M.D.   On: 03/06/2015 18:23  I have personally reviewed and evaluated these images and lab results as part of my medical decision-making.   EKG Interpretation   Date/Time:  Friday March 06 2015 17:40:45 EST Ventricular Rate:  118 PR Interval:  86 QRS Duration: 78 QT Interval:  328 QTC Calculation: 459 R Axis:   75 Text Interpretation:  Sinus tachycardia Borderline repolarization  abnormality Baseline wander in lead(s) V1 No old tracing to compare  Confirmed by Elowen Debruyn  MD, Christon Parada ([id number]) on 03/06/2015 11:52:08 PM      MDM   Final diagnoses:  Anxiety  Essential hypertension    Pt with panic attack yesterday with associated jaw pain and SOB.  No symptoms today.  Her arm pain and bilateral leg pain suggest a musculoskeletal etiology.  No signs of a DVT.  No ischemic changes on EKG or other suggestions of ACS.  PT without symptoms today.  No symptoms that would be more consistent with TIA/CVA.  Her HR is down into 80s.  Her BP is up, but hasn't taken her evening meds.  Will d/c. I advise close follow-up with her PCP on Monday. Return precautions were given.  I personally performed the services described in this documentation, which was scribed in my presence.  The recorded information has been reviewed and considered.    Rolan Bucco, MD 03/07/15 [phone]

## 2015-03-06 NOTE — ED Notes (Signed)
Patient c/o left arm pain and numbness, bilateral leg pain. Patient states she had the same symptoms yesterday along with a panic attack. Patient denies a panic attack today. Patient called her physician and was told to come to the ED. Patient also c/o hypertension. Patient states she has taken her BP med /metropolol once today.

## 2015-03-07 NOTE — Discharge Instructions (Signed)
Hypertension  Hypertension, commonly called high blood pressure, is when the force of blood pumping through your arteries is too strong. Your arteries are the blood vessels that carry blood from your heart throughout your body. A blood pressure reading consists of a higher number over a lower number, such as 110/72. The higher number (systolic) is the pressure inside your arteries when your heart pumps. The lower number (diastolic) is the pressure inside your arteries when your heart relaxes. Ideally you want your blood pressure below 120/80.  Hypertension forces your heart to work harder to pump blood. Your arteries may become narrow or stiff. Having untreated or uncontrolled hypertension can cause heart attack, stroke, kidney disease, and other problems.  RISK FACTORS  Some risk factors for high blood pressure are controllable. Others are not.   Risk factors you cannot control include:   · Race. You may be at higher risk if you are African American.  · Age. Risk increases with age.  · Gender. Men are at higher risk than women before age 45 years. After age 65, women are at higher risk than men.  Risk factors you can control include:  · Not getting enough exercise or physical activity.  · Being overweight.  · Getting too much fat, sugar, calories, or salt in your diet.  · Drinking too much alcohol.  SIGNS AND SYMPTOMS  Hypertension does not usually cause signs or symptoms. Extremely high blood pressure (hypertensive crisis) may cause headache, anxiety, shortness of breath, and nosebleed.  DIAGNOSIS  To check if you have hypertension, your health care provider will measure your blood pressure while you are seated, with your arm held at the level of your heart. It should be measured at least twice using the same arm. Certain conditions can cause a difference in blood pressure between your right and left arms. A blood pressure reading that is higher than normal on one occasion does not mean that you need treatment. If  it is not clear whether you have high blood pressure, you may be asked to return on a different day to have your blood pressure checked again. Or, you may be asked to monitor your blood pressure at home for 1 or more weeks.  TREATMENT  Treating high blood pressure includes making lifestyle changes and possibly taking medicine. Living a healthy lifestyle can help lower high blood pressure. You may need to change some of your habits.  Lifestyle changes may include:  · Following the DASH diet. This diet is high in fruits, vegetables, and whole grains. It is low in salt, red meat, and added sugars.  · Keep your sodium intake below 2,300 mg per day.  · Getting at least 30-45 minutes of aerobic exercise at least 4 times per week.  · Losing weight if necessary.  · Not smoking.  · Limiting alcoholic beverages.  · Learning ways to reduce stress.  Your health care provider may prescribe medicine if lifestyle changes are not enough to get your blood pressure under control, and if one of the following is true:  · You are 18-59 years of age and your systolic blood pressure is above 140.  · You are 60 years of age or older, and your systolic blood pressure is above 150.  · Your diastolic blood pressure is above 90.  · You have diabetes, and your systolic blood pressure is over 140 or your diastolic blood pressure is over 90.  · You have kidney disease and your blood pressure is above   140/90.  · You have heart disease and your blood pressure is above 140/90.  Your personal target blood pressure may vary depending on your medical conditions, your age, and other factors.  HOME CARE INSTRUCTIONS  · Have your blood pressure rechecked as directed by your health care provider.    · Take medicines only as directed by your health care provider. Follow the directions carefully. Blood pressure medicines must be taken as prescribed. The medicine does not work as well when you skip doses. Skipping doses also puts you at risk for  problems.  · Do not smoke.    · Monitor your blood pressure at home as directed by your health care provider.   SEEK MEDICAL CARE IF:   · You think you are having a reaction to medicines taken.  · You have recurrent headaches or feel dizzy.  · You have swelling in your ankles.  · You have trouble with your vision.  SEEK IMMEDIATE MEDICAL CARE IF:  · You develop a severe headache or confusion.  · You have unusual weakness, numbness, or feel faint.  · You have severe chest or abdominal pain.  · You vomit repeatedly.  · You have trouble breathing.  MAKE SURE YOU:   · Understand these instructions.  · Will watch your condition.  · Will get help right away if you are not doing well or get worse.     This information is not intended to replace advice given to you by your health care provider. Make sure you discuss any questions you have with your health care provider.     Document Released: 01/10/2005 Document Revised: 05/27/2014 Document Reviewed: 11/02/2012  Elsevier Interactive Patient Education ©2016 [employer]    Panic Attacks  Panic attacks are sudden, short-lived surges of severe anxiety, fear, or discomfort. They may occur for no reason when you are relaxed, when you are anxious, or when you are sleeping. Panic attacks may occur for a number of reasons:   · Healthy people occasionally have panic attacks in extreme, life-threatening situations, such as war or natural disasters. Normal anxiety is a protective mechanism of the body that helps us react to danger (fight or flight response).  · Panic attacks are often seen with anxiety disorders, such as panic disorder, social anxiety disorder, generalized anxiety disorder, and phobias. Anxiety disorders cause excessive or uncontrollable anxiety. They may interfere with your relationships or other life activities.  · Panic attacks are sometimes seen with other mental illnesses, such as depression and posttraumatic stress disorder.  · Certain medical conditions,  prescription medicines, and drugs of abuse can cause panic attacks.  SYMPTOMS   Panic attacks start suddenly, peak within 20 minutes, and are accompanied by four or more of the following symptoms:  · Pounding heart or fast heart rate (palpitations).  · Sweating.  · Trembling or shaking.  · Shortness of breath or feeling smothered.  · Feeling choked.  · Chest pain or discomfort.  · Nausea or strange feeling in your stomach.  · Dizziness, light-headedness, or feeling like you will faint.  · Chills or hot flushes.  · Numbness or tingling in your lips or hands and feet.  · Feeling that things are not real or feeling that you are not yourself.  · Fear of losing control or going crazy.  · Fear of dying.  Some of these symptoms can mimic serious medical conditions. For example, you may think you are having a heart attack. Although panic attacks can be very scary, they   are not life threatening.  DIAGNOSIS   Panic attacks are diagnosed through an assessment by your health care provider. Your health care provider will ask questions about your symptoms, such as where and when they occurred. Your health care provider will also ask about your medical history and use of alcohol and drugs, including prescription medicines. Your health care provider may order blood tests or other studies to rule out a serious medical condition. Your health care provider may refer you to a mental health professional for further evaluation.  TREATMENT   · Most healthy people who have one or two panic attacks in an extreme, life-threatening situation will not require treatment.  · The treatment for panic attacks associated with anxiety disorders or other mental illness typically involves counseling with a mental health professional, medicine, or a combination of both. Your health care provider will help determine what treatment is best for you.  · Panic attacks due to physical illness usually go away with treatment of the illness. If prescription  medicine is causing panic attacks, talk with your health care provider about stopping the medicine, decreasing the dose, or substituting another medicine.  · Panic attacks due to alcohol or drug abuse go away with abstinence. Some adults need professional help in order to stop drinking or using drugs.  HOME CARE INSTRUCTIONS   · Take all medicines as directed by your health care provider.    · Schedule and attend follow-up visits as directed by your health care provider. It is important to keep all your appointments.  SEEK MEDICAL CARE IF:  · You are not able to take your medicines as prescribed.  · Your symptoms do not improve or get worse.  SEEK IMMEDIATE MEDICAL CARE IF:   · You experience panic attack symptoms that are different than your usual symptoms.  · You have serious thoughts about hurting yourself or others.  · You are taking medicine for panic attacks and have a serious side effect.  MAKE SURE YOU:  · Understand these instructions.  · Will watch your condition.  · Will get help right away if you are not doing well or get worse.     This information is not intended to replace advice given to you by your health care provider. Make sure you discuss any questions you have with your health care provider.     Document Released: 01/10/2005 Document Revised: 01/15/2013 Document Reviewed: 08/24/2012  Elsevier Interactive Patient Education ©2016 [employer]

## 2016-07-28 ENCOUNTER — Encounter (HOSPITAL_COMMUNITY): Payer: Self-pay | Admitting: Emergency Medicine

## 2016-07-28 ENCOUNTER — Emergency Department (HOSPITAL_COMMUNITY)
Admission: EM | Admit: 2016-07-28 | Discharge: 2016-07-29 | Disposition: A | Discharge status: Home / Self Care | Payer: Medicaid Other | Attending: Emergency Medicine | Admitting: Emergency Medicine

## 2016-07-28 DIAGNOSIS — Z79899 Other long term (current) drug therapy: Secondary | ICD-10-CM | POA: Diagnosis not present

## 2016-07-28 DIAGNOSIS — I10 Essential (primary) hypertension: Secondary | ICD-10-CM | POA: Diagnosis not present

## 2016-07-28 DIAGNOSIS — R112 Nausea with vomiting, unspecified: Secondary | ICD-10-CM

## 2016-07-28 DIAGNOSIS — B349 Viral infection, unspecified: Secondary | ICD-10-CM | POA: Diagnosis not present

## 2016-07-28 DIAGNOSIS — Z7951 Long term (current) use of inhaled steroids: Secondary | ICD-10-CM | POA: Diagnosis not present

## 2016-07-28 DIAGNOSIS — J449 Chronic obstructive pulmonary disease, unspecified: Secondary | ICD-10-CM | POA: Diagnosis not present

## 2016-07-28 DIAGNOSIS — F17210 Nicotine dependence, cigarettes, uncomplicated: Secondary | ICD-10-CM | POA: Diagnosis not present

## 2016-07-28 LAB — COMPREHENSIVE METABOLIC PANEL
ALK PHOS: 74 U/L (ref 38–126)
ALT: 12 U/L — ABNORMAL LOW (ref 14–54)
ANION GAP: 11 (ref 5–15)
AST: 21 U/L (ref 15–41)
Albumin: 4.5 g/dL (ref 3.5–5.0)
BILIRUBIN TOTAL: 0.6 mg/dL (ref 0.3–1.2)
BUN: 8 mg/dL (ref 6–20)
CALCIUM: 9.2 mg/dL (ref 8.9–10.3)
CO2: 27 mmol/L (ref 22–32)
Chloride: 100 mmol/L — ABNORMAL LOW (ref 101–111)
Creatinine, Ser: 0.71 mg/dL (ref 0.44–1.00)
Glucose, Bld: 116 mg/dL — ABNORMAL HIGH (ref 65–99)
Potassium: 4.1 mmol/L (ref 3.5–5.1)
SODIUM: 138 mmol/L (ref 135–145)
TOTAL PROTEIN: 8.2 g/dL — AB (ref 6.5–8.1)

## 2016-07-28 LAB — CBC
HCT: 44.9 % (ref 36.0–46.0)
HEMOGLOBIN: 15.5 g/dL — AB (ref 12.0–15.0)
MCH: 31.1 pg (ref 26.0–34.0)
MCHC: 34.5 g/dL (ref 30.0–36.0)
MCV: 90 fL (ref 78.0–100.0)
Platelets: 296 10*3/uL (ref 150–400)
RBC: 4.99 MIL/uL (ref 3.87–5.11)
RDW: 12.6 % (ref 11.5–15.5)
WBC: 7.5 10*3/uL (ref 4.0–10.5)

## 2016-07-28 LAB — LIPASE, BLOOD: Lipase: 21 U/L (ref 11–51)

## 2016-07-28 MED ORDER — PANTOPRAZOLE SODIUM 40 MG IV SOLR
40.0000 mg | Freq: Once | INTRAVENOUS | Status: AC
  Administered 2016-07-28: 40 mg via INTRAVENOUS
  Filled 2016-07-28: qty 40

## 2016-07-28 MED ORDER — SODIUM CHLORIDE 0.9 % IV BOLUS (SEPSIS)
1000.0000 mL | Freq: Once | INTRAVENOUS | Status: AC
  Administered 2016-07-28: 1000 mL via INTRAVENOUS

## 2016-07-28 MED ORDER — DICYCLOMINE HCL 10 MG/ML IM SOLN
20.0000 mg | Freq: Once | INTRAMUSCULAR | Status: AC
  Administered 2016-07-28: 20 mg via INTRAMUSCULAR
  Filled 2016-07-28: qty 2

## 2016-07-28 MED ORDER — ONDANSETRON HCL 4 MG/2ML IJ SOLN
4.0000 mg | Freq: Once | INTRAMUSCULAR | Status: AC
  Administered 2016-07-28: 4 mg via INTRAVENOUS
  Filled 2016-07-28: qty 2

## 2016-07-28 NOTE — ED Triage Notes (Signed)
Pt c/o fever, emesis, headache, periumbilical tenderness x 1.5 weeks. 1 episode of emesis over weekend caused SOB and tingling fingers, which resolved with Pro-Air inhaler. Pt and family had gastroenteritis 07/08/16 to 07/10/16, all recovered but patient's symptoms returned. Pt also reports rash x 3 days to right arm onset after getting Prolia injection.

## 2016-07-28 NOTE — ED Provider Notes (Signed)
[facility]-EMERGENCY DEPT Provider Note   CSN: [id number] Arrival date & time: 07/28/16  1823    History   Chief Complaint Chief Complaint  Patient presents with  . Emesis    HPI Dana Powers is a 61 y.o. female.  61 year old female with a history of hypertension, chronic back pain and sciatica (on chronic suboxone), dyslipidemia, COPD, migraine headaches, and esophageal reflux presents to the emergency department for multiple complaints. She notes 1.5 weeks of her umbilical discomfort associated with sporadic nausea, diarrhea, and vomiting. She has been unable to tolerate fluids or food by mouth today due to emesis. Patient also notes subjective fever characterized by feeling hot with chills. She has had sweats at times as well. She further notes an occipital headache which has been fairly constant. She notes exposure to sick contacts with gastroenteritis 3 weeks ago. She initially became ill shortly after initial contact, but was mildly improving until the week of June 25th. She has been unable to take some of her home medications due to emesis. She states that some of her symptoms may be secondary to withdrawal from her chronic medications. She has been able to take small amounts of her Suboxone.   History very difficult to obtain as patient's thoughts are scattered necessitating multiple moments of redirection. No hx of abdominal surgeries.   The history is provided by the patient and the spouse. No language interpreter was used.  Emesis      Past Medical History:  Diagnosis Date  . Acid reflux   . Anxiety   . Chronic back pain   . COPD (chronic obstructive pulmonary disease) (HCC)   . Depression   . High cholesterol   . Hypertension   . Migraine   . Myopia   . Osteoarthritis   . Osteopenia   . Osteoporosis   . PVD (posterior vitreous detachment), both eyes   . Sciatica     There are no active problems to display for this patient.   Past Surgical History:  Procedure  Laterality Date  . degenerative disc disease    . FRACTURE SURGERY     t11  . KNEE SURGERY    . neck fusion    . SHOULDER ARTHROSCOPY W/ ROTATOR CUFF REPAIR      OB History    No data available       Home Medications    Prior to Admission medications   Medication Sig Start Date End Date Taking? Authorizing Provider  albuterol (PROVENTIL HFA;VENTOLIN HFA) 108 (90 BASE) MCG/ACT inhaler Inhale 1 puff into the lungs every 6 (six) hours as needed for wheezing or shortness of breath.   Yes [provider]  Ascorbic Acid (VITAMIN C) 1000 MG tablet Take 1,000 mg by mouth daily.    Yes [provider]  beclomethasone (QVAR) 80 MCG/ACT inhaler Inhale 1 puff into the lungs 2 (two) times daily.   Yes [provider]  Cholecalciferol (VITAMIN D-1000 MAX ST) 1000 units tablet Take 1,000 Units by mouth daily.    Yes [provider]  cyclobenzaprine (FLEXERIL) 10 MG tablet Take 10 mg by mouth 3 (three) times daily as needed for muscle spasms.   Yes [provider]  denosumab (PROLIA) 60 MG/ML SOLN injection Inject 60 mg into the skin every 6 (six) months. Administer in upper arm, thigh, or abdomen   Yes [provider]  ezetimibe (ZETIA) 10 MG tablet Take 10 mg by mouth daily.   Yes [provider]  meloxicam (MOBIC)  7.5 MG tablet TAKE 1 TABLET (7.5 MG TOTAL) BY MOUTH 2 TIMES DAILY. 02/27/15  Yes [provider]  metoprolol (LOPRESSOR) 50 MG tablet Take 50 mg by mouth 2 (two) times daily.   Yes [provider]  Multiple Vitamins-Minerals (ZINC PO) Take 1 tablet by mouth daily.   Yes [provider]  pantoprazole (PROTONIX) 40 MG tablet TAKE 1 TABLET BY MOUTH EVERY MORNING BEFORE BREAKFAST 02/27/15  Yes [provider]  ranitidine (ZANTAC) 300 MG tablet Take 300 mg by mouth at bedtime.   Yes [provider]  simvastatin (ZOCOR) 40 MG tablet Take 40 mg by mouth daily.   Yes [provider]    SUBOXONE 8-2 MG FILM Place 1 strip under the tongue twice daily 02/28/15  Yes [provider]  tiotropium (SPIRIVA) 18 MCG inhalation capsule Place 18 mcg into inhaler and inhale daily.    Yes [provider]  vitamin E 100 UNIT capsule Take 100 Units by mouth daily.    Yes [provider]  dicyclomine (BENTYL) 20 MG tablet Take 1 tablet (20 mg total) by mouth 2 (two) times daily as needed (abdominal pain/cramping). 07/29/16   Antony Madura, PA-C  promethazine (PHENERGAN) 25 MG tablet Take 1 tablet (25 mg total) by mouth every 6 (six) hours as needed for nausea or vomiting. 07/29/16   Antony Madura, PA-C    Family History Family History  Problem Relation Age of Onset  . Heart failure Father     Social History Social History  Substance Use Topics  . Smoking status: Current Some Day Smoker    Packs/day: 0.25    Types: Cigarettes  . Smokeless tobacco: Never Used  . Alcohol use No     Allergies   Crab [shellfish allergy]; Imitrex [sumatriptan]; Robaxin [methocarbamol]; Penicillins; Sulfa antibiotics; Toradol [ketorolac tromethamine]; and Tramadol   Review of Systems Review of Systems  Gastrointestinal: Positive for vomiting.  Ten systems reviewed and are negative for acute change, except as noted in the HPI.    Physical Exam Updated Vital Signs BP (!) 102/49 (BP Location: Left Arm)   Pulse 75   Temp 97.8 F (36.6 C) (Oral)   Resp 16   SpO2 99%   Physical Exam  Constitutional: She is oriented to person, place, and time. She appears well-developed and well-nourished. No distress.  Nontoxic and in NAD. Sporadic bilious emesis noted.  HENT:  Head: Normocephalic and atraumatic.  Eyes: Conjunctivae and EOM are normal. No scleral icterus.  Neck: Normal range of motion.  Cardiovascular: Normal rate, regular rhythm and intact distal pulses.   Pulmonary/Chest: Effort normal. No respiratory distress. She has no wheezes.  Respirations even and unlabored   Abdominal: Soft. She exhibits no distension and no mass. There is tenderness (generalized).  Musculoskeletal: Normal range of motion.  Neurological: She is alert and oriented to person, place, and time. She exhibits normal muscle tone. Coordination normal.  GCS 15. Patient moving all extremities.  Skin: Skin is warm and dry. No rash noted. She is not diaphoretic. No erythema. No pallor.  Psychiatric: She has a normal mood and affect. Her behavior is normal.  Nursing note and vitals reviewed.    ED Treatments / Results  Labs (all labs ordered are listed, but only abnormal results are displayed) Labs Reviewed  COMPREHENSIVE METABOLIC PANEL - Abnormal; Notable for the following:       Result Value   Chloride 100 (*)    Glucose, Bld 116 (*)  Total Protein 8.2 (*)    ALT 12 (*)    All other components within normal limits  CBC - Abnormal; Notable for the following:    Hemoglobin 15.5 (*)    All other components within normal limits  URINALYSIS, ROUTINE W REFLEX MICROSCOPIC - Abnormal; Notable for the following:    Color, Urine STRAW (*)    Ketones, ur 20 (*)    All other components within normal limits  LIPASE, BLOOD    EKG  EKG Interpretation None       Radiology No results found.  Procedures Procedures (including critical care time)  Medications Ordered in ED Medications  ondansetron (ZOFRAN) injection 4 mg (4 mg Intravenous Given 07/28/16 2229)  sodium chloride 0.9 % bolus 1,000 mL (0 mLs Intravenous Stopped 07/29/16 0025)  pantoprazole (PROTONIX) injection 40 mg (40 mg Intravenous Given 07/28/16 2229)  dicyclomine (BENTYL) injection 20 mg (20 mg Intramuscular Given 07/28/16 2229)  sodium chloride 0.9 % bolus 1,000 mL (0 mLs Intravenous Stopped 07/29/16 0245)  prochlorperazine (COMPAZINE) injection 10 mg (10 mg Intravenous Given 07/29/16 0136)  diphenhydrAMINE (BENADRYL) injection 25 mg (25 mg Intravenous Given 07/29/16 0135)     Initial Impression / Assessment and Plan /  ED Course  I have reviewed the triage vital signs and the nursing notes.  Pertinent labs & imaging results that were available during my care of the patient were reviewed by me and considered in my medical decision making (see chart for details).     Patient with symptoms consistent with viral illness, likely gastroenteritis. Vitals are stable, no fever. No signs of dehydration, tolerating PO fluids > 6 oz. Lungs are clear. No focal abdominal pain. Doubt emergent or surgical etiology such as appendicitis, cholecystitis, pancreatitis, ruptured viscus, UTI, kidney stone, or any other abdominal etiology. Supportive therapy indicated with return if symptoms worsen. Patient discharged in stable condition with no unaddressed concerns.   Vitals:   07/29/16 0025 07/29/16 0141 07/29/16 0143 07/29/16 0234  BP: 118/67  (!) 143/65 (!) 102/49  Pulse: 91  96 75  Resp: 16  16 16   Temp:   97.8 F (36.6 C)   TempSrc:   Oral   SpO2: 98% 98% 97% 99%    Final Clinical Impressions(s) / ED Diagnoses   Final diagnoses:  Viral illness  Non-intractable vomiting with nausea, unspecified vomiting type    New Prescriptions Discharge Medication List as of 07/29/2016  2:26 AM    START taking these medications   Details  dicyclomine (BENTYL) 20 MG tablet Take 1 tablet (20 mg total) by mouth 2 (two) times daily as needed (abdominal pain/cramping)., Starting Fri 07/29/2016, Print    promethazine (PHENERGAN) 25 MG tablet Take 1 tablet (25 mg total) by mouth every 6 (six) hours as needed for nausea or vomiting., Starting Fri 07/29/2016, Print         Antony Madura, PA-C 07/29/16 1610    Pricilla Loveless, MD 07/31/16 [phone]

## 2016-07-29 LAB — URINALYSIS, ROUTINE W REFLEX MICROSCOPIC
BILIRUBIN URINE: NEGATIVE
Glucose, UA: NEGATIVE mg/dL
HGB URINE DIPSTICK: NEGATIVE
Ketones, ur: 20 mg/dL — AB
Leukocytes, UA: NEGATIVE
Nitrite: NEGATIVE
PH: 7 (ref 5.0–8.0)
Protein, ur: NEGATIVE mg/dL
SPECIFIC GRAVITY, URINE: 1.005 (ref 1.005–1.030)

## 2016-07-29 MED ORDER — PROMETHAZINE HCL 25 MG/ML IJ SOLN: 12.5000 mg | Freq: Once | INTRAMUSCULAR | Status: DC

## 2016-07-29 MED ORDER — SODIUM CHLORIDE 0.9 % IV BOLUS (SEPSIS)
1000.0000 mL | Freq: Once | INTRAVENOUS | Status: AC
  Administered 2016-07-29: 1000 mL via INTRAVENOUS

## 2016-07-29 MED ORDER — PROCHLORPERAZINE EDISYLATE 5 MG/ML IJ SOLN
10.0000 mg | Freq: Once | INTRAMUSCULAR | Status: AC
  Administered 2016-07-29: 10 mg via INTRAVENOUS
  Filled 2016-07-29: qty 2

## 2016-07-29 MED ORDER — PROMETHAZINE HCL 25 MG PO TABS: 25.0000 mg | ORAL_TABLET | Freq: Four times a day (QID) | ORAL | 0 refills | Status: AC | PRN

## 2016-07-29 MED ORDER — DICYCLOMINE HCL 20 MG PO TABS: 20.0000 mg | ORAL_TABLET | Freq: Two times a day (BID) | ORAL | 0 refills | Status: AC | PRN

## 2016-07-29 MED ORDER — DIPHENHYDRAMINE HCL 50 MG/ML IJ SOLN
25.0000 mg | Freq: Once | INTRAMUSCULAR | Status: AC
  Administered 2016-07-29: 25 mg via INTRAVENOUS
  Filled 2016-07-29: qty 1

## 2017-04-12 ENCOUNTER — Other Ambulatory Visit: Payer: Self-pay

## 2017-04-12 ENCOUNTER — Emergency Department (HOSPITAL_COMMUNITY)
Admission: EM | Admit: 2017-04-12 | Discharge: 2017-04-12 | Disposition: A | Discharge status: Home / Self Care | Payer: Medicaid Other | Attending: Emergency Medicine | Admitting: Emergency Medicine

## 2017-04-12 ENCOUNTER — Emergency Department (HOSPITAL_COMMUNITY): Payer: Medicaid Other

## 2017-04-12 ENCOUNTER — Encounter (HOSPITAL_COMMUNITY): Payer: Self-pay | Admitting: *Deleted

## 2017-04-12 DIAGNOSIS — M545 Low back pain: Secondary | ICD-10-CM | POA: Insufficient documentation

## 2017-04-12 DIAGNOSIS — J449 Chronic obstructive pulmonary disease, unspecified: Secondary | ICD-10-CM | POA: Insufficient documentation

## 2017-04-12 DIAGNOSIS — F17210 Nicotine dependence, cigarettes, uncomplicated: Secondary | ICD-10-CM | POA: Diagnosis not present

## 2017-04-12 DIAGNOSIS — G8929 Other chronic pain: Secondary | ICD-10-CM | POA: Diagnosis not present

## 2017-04-12 DIAGNOSIS — Z79899 Other long term (current) drug therapy: Secondary | ICD-10-CM | POA: Insufficient documentation

## 2017-04-12 DIAGNOSIS — I10 Essential (primary) hypertension: Secondary | ICD-10-CM | POA: Diagnosis not present

## 2017-04-12 NOTE — ED Notes (Signed)
Provided patient with turkey sandwich.

## 2017-04-12 NOTE — ED Provider Notes (Signed)
[facility] EMERGENCY DEPARTMENT Provider Note   CSN: [id number] Arrival date & time: 04/12/17  1617     History   Chief Complaint Chief Complaint  Patient presents with  . Back Pain    HPI Dana Powers is a 62 y.o. female with past medical history of degenerative disc disease, osteoporosis, hypertension, sciatica, presenting to the ED with worsening chronic low back pain since yesterday.  Patient states she slid off the bed onto the floor onto her bottom, and has been having worsening pain since.  She states she has a history of fractured coccyx 2 times, and is having pain in her tailbone is bilateral lower back. Pain is worse with ambulation and particular moments. States she has sciatica at baseline, without significant worsening in tingling sensation.  Takes hydrocodone and Flexeril at home for pain.  Denies numbness, bowel or bladder incontinence, history of IV drug use.  She attempted to see her orthopedic physician, however he became more specialized into upper extremities and was unable to see her.   The history is provided by the patient.    Past Medical History:  Diagnosis Date  . Acid reflux   . Anxiety   . Chronic back pain   . COPD (chronic obstructive pulmonary disease) (HCC)   . Depression   . High cholesterol   . Hypertension   . Migraine   . Myopia   . Osteoarthritis   . Osteopenia   . Osteoporosis   . PVD (posterior vitreous detachment), both eyes   . Sciatica     There are no active problems to display for this patient.   Past Surgical History:  Procedure Laterality Date  . degenerative disc disease    . FRACTURE SURGERY     t11  . KNEE SURGERY    . neck fusion    . SHOULDER ARTHROSCOPY W/ ROTATOR CUFF REPAIR      OB History    No data available       Home Medications    Prior to Admission medications   Medication Sig Start Date End Date Taking? Authorizing Provider  albuterol (PROVENTIL HFA;VENTOLIN HFA) 108 (90  BASE) MCG/ACT inhaler Inhale 1 puff into the lungs every 6 (six) hours as needed for wheezing or shortness of breath.    [provider]  Ascorbic Acid (VITAMIN C) 1000 MG tablet Take 1,000 mg by mouth daily.     [provider]  beclomethasone (QVAR) 80 MCG/ACT inhaler Inhale 1 puff into the lungs 2 (two) times daily.    [provider]  Cholecalciferol (VITAMIN D-1000 MAX ST) 1000 units tablet Take 1,000 Units by mouth daily.     [provider]  cyclobenzaprine (FLEXERIL) 10 MG tablet Take 10 mg by mouth 3 (three) times daily as needed for muscle spasms.    [provider]  denosumab (PROLIA) 60 MG/ML SOLN injection Inject 60 mg into the skin every 6 (six) months. Administer in upper arm, thigh, or abdomen    [provider]  dicyclomine (BENTYL) 20 MG tablet Take 1 tablet (20 mg total) by mouth 2 (two) times daily as needed (abdominal pain/cramping). 07/29/16   Antony Madura, PA-C  ezetimibe (ZETIA) 10 MG tablet Take 10 mg by mouth daily.    [provider]  meloxicam (MOBIC) 7.5 MG tablet TAKE 1 TABLET (7.5 MG TOTAL) BY MOUTH 2 TIMES DAILY. 02/27/15   [provider]  metoprolol (LOPRESSOR) 50 MG tablet Take 50 mg by mouth  2 (two) times daily.    [provider]  Multiple Vitamins-Minerals (ZINC PO) Take 1 tablet by mouth daily.    [provider]  pantoprazole (PROTONIX) 40 MG tablet TAKE 1 TABLET BY MOUTH EVERY MORNING BEFORE BREAKFAST 02/27/15   [provider]  promethazine (PHENERGAN) 25 MG tablet Take 1 tablet (25 mg total) by mouth every 6 (six) hours as needed for nausea or vomiting. 07/29/16   Antony MaduraHumes, Kelly, PA-C  ranitidine (ZANTAC) 300 MG tablet Take 300 mg by mouth at bedtime.    [provider]  simvastatin (ZOCOR) 40 MG tablet Take 40 mg by mouth daily.    [provider]  SUBOXONE 8-2 MG FILM Place 1 strip under the tongue twice daily 02/28/15   [provider]    tiotropium (SPIRIVA) 18 MCG inhalation capsule Place 18 mcg into inhaler and inhale daily.     [provider]  vitamin E 100 UNIT capsule Take 100 Units by mouth daily.     [provider]    Family History Family History  Problem Relation Age of Onset  . Heart failure Father     Social History Social History   Tobacco Use  . Smoking status: Current Some Day Smoker    Packs/day: 0.25    Types: Cigarettes  . Smokeless tobacco: Never Used  Substance Use Topics  . Alcohol use: No  . Drug use: Yes    Types: Marijuana    Comment: 10 days ago.     Allergies   Crab [shellfish allergy]; Imitrex [sumatriptan]; Robaxin [methocarbamol]; Penicillins; Sulfa antibiotics; Toradol [ketorolac tromethamine]; and Tramadol   Review of Systems Review of Systems  Constitutional: Negative for fever.  Gastrointestinal:       No bowel incontinence  Genitourinary: Negative for difficulty urinating.  Musculoskeletal: Positive for back pain.  Neurological: Negative for weakness and numbness.     Physical Exam Updated Vital Signs BP (!) 142/54 (BP Location: Right Arm)   Pulse 90   Temp 98 F (36.7 C) (Oral)   Resp 16   SpO2 95%   Physical Exam  Constitutional: She appears well-developed and well-nourished.  HENT:  Head: Normocephalic and atraumatic.  Eyes: Conjunctivae are normal.  Cardiovascular: Normal rate, normal heart sounds and intact distal pulses.  Pulmonary/Chest: Effort normal and breath sounds normal.  Musculoskeletal:       Back:  No midline spinal or paraspinal tenderness, no bony step-offs or gross deformities.  Bilateral lower back musculature with tenderness, extending into bilateral gluteal muscles.  Neurological:  Motor:  Normal tone. 5/5 in lower extremities bilaterally including strong and equal dorsiflexion/plantar flexion Sensory: Pinprick and light touch normal in BLE  Deep Tendon Reflexes: 2+ and symmetric in the biceps and  patella Cerebellar: normal heel-to-shin with bilateral lower extremities CV: distal pulses palpable throughout    Psychiatric: She has a normal mood and affect. Her behavior is normal.  Nursing note and vitals reviewed.    ED Treatments / Results  Labs (all labs ordered are listed, but only abnormal results are displayed) Labs Reviewed - No data to display  EKG  EKG Interpretation None       Radiology Dg Lumbar Spine Complete  Result Date: 04/12/2017 CLINICAL DATA:  Mechanical fall with back pain. EXAM: LUMBAR SPINE - COMPLETE 4+ VIEW COMPARISON:  None. FINDINGS: There is no evidence of lumbar spine fracture. Mild facet arthropathy with joint space narrowing and sclerosis is noted at L5-S1. Alignment is normal. Slight disc space  narrowing noted at L2-3 and L3-4. Aortic atherosclerosis is identified without aneurysm. The patient is status post cholecystectomy IMPRESSION: Slight disc space narrowing L2-3 and L3-4 with facet arthropathy at L5-S1. No acute osseous abnormality. Electronically Signed   By: Tollie Eth M.D.   On: 04/12/2017 20:13    Procedures Procedures (including critical care time)  Medications Ordered in ED Medications - No data to display   Initial Impression / Assessment and Plan / ED Course  I have reviewed the triage vital signs and the nursing notes.  Pertinent labs & imaging results that were available during my care of the patient were reviewed by me and considered in my medical decision making (see chart for details).     Patient with acute on chronic back pain s/p mechanical fall that occurred yesterday.  No neurological deficits and normal neuro exam.  Patient can walk but states is painful.  No loss of bowel or bladder control.  No concern for cauda equina.  No fever, night sweats, weight loss, h/o IVDU.  L-spine xray with degenerative changes, neg for acute pathology. RICE protocol and home pain medicine indicated and discussed with patient.  Discussed follow up with PCP and strict return precautions. Safe for discharge.  Discussed results, findings, treatment and follow up. Patient advised of return precautions. Patient verbalized understanding and agreed with plan.  Final Clinical Impressions(s) / ED Diagnoses   Final diagnoses:  Acute exacerbation of chronic low back pain    ED Discharge Orders    None       Muna Demers, [place] N, PA-C 04/12/17 2039    Linwood Dibbles, MD 04/13/17 [phone]

## 2017-04-12 NOTE — ED Triage Notes (Signed)
To ED for eval of lower back pain after sliding off the bed yesterday. Pt is ambulatory. States she called her ortho doc but he has switched practices and she will need new doctor. Pt took pain meds at home pta.

## 2017-04-12 NOTE — ED Notes (Signed)
Patient verbalized understanding of discharge instructions and denies any further needs or questions at this time. VS stable. Patient ambulatory with steady gait, RN escorted to ED entrance in wheelchair.   

## 2017-04-12 NOTE — Discharge Instructions (Signed)
Please read instructions below.  Follow up with your PCP about your visit today.  You can take your previously prescribed medications as needed for pain.  Apply ice to your back for 20 minutes at a time. You can also apply heat if this provides more relief. Return to ER if new numbness or tingling in your arms or legs, inability to urinate, inability to hold your bowels, or weakness in your extremities.

## 2019-10-07 IMAGING — DX DG LUMBAR SPINE COMPLETE 4+V
5 series · 5 of 5 positions shown · non-contrast
Comparison: None.

CLINICAL DATA: Mechanical fall with back pain.

EXAM:
LUMBAR SPINE - COMPLETE 4+ VIEW

[t lumbar spine ap]
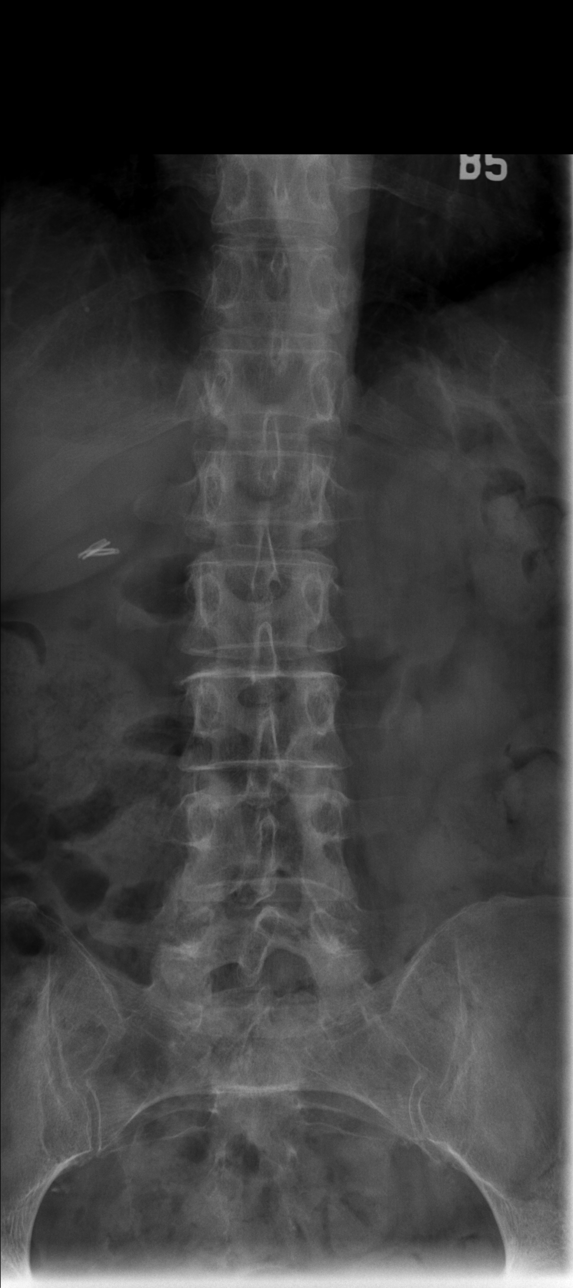

[t lumbar spine obl (1 of 2)]
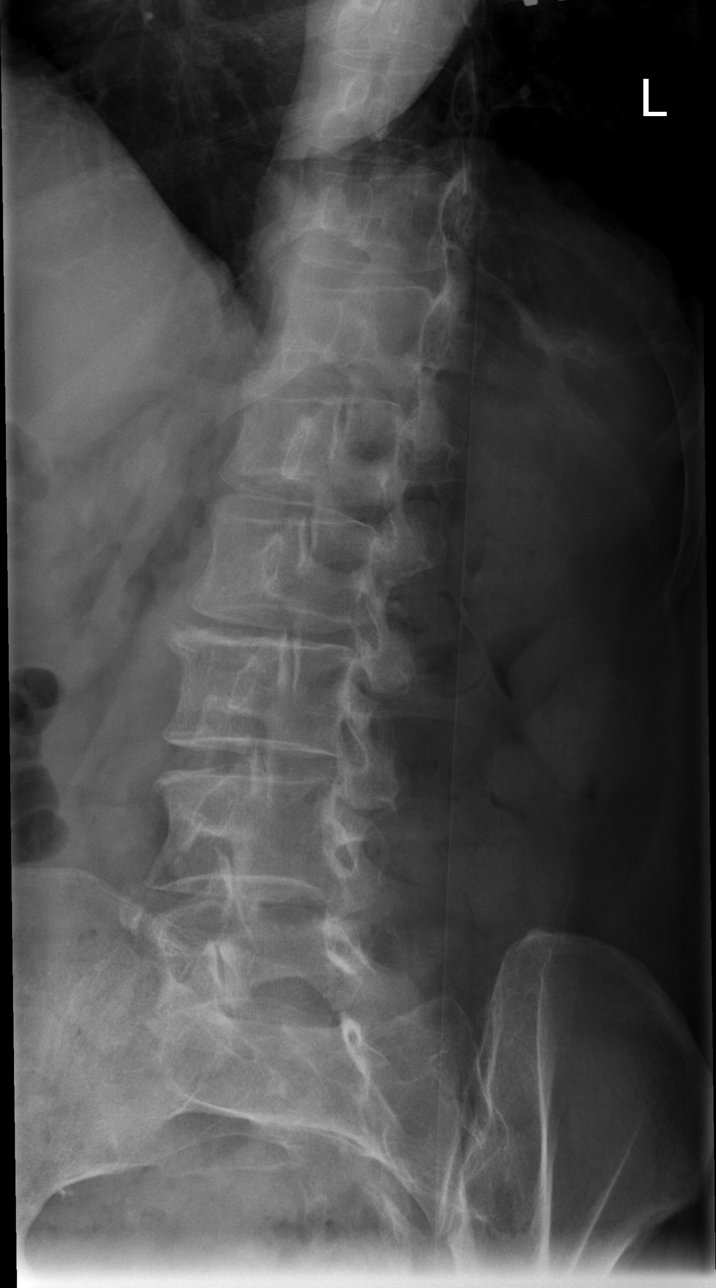

[t lumbar spine obl (2 of 2)]
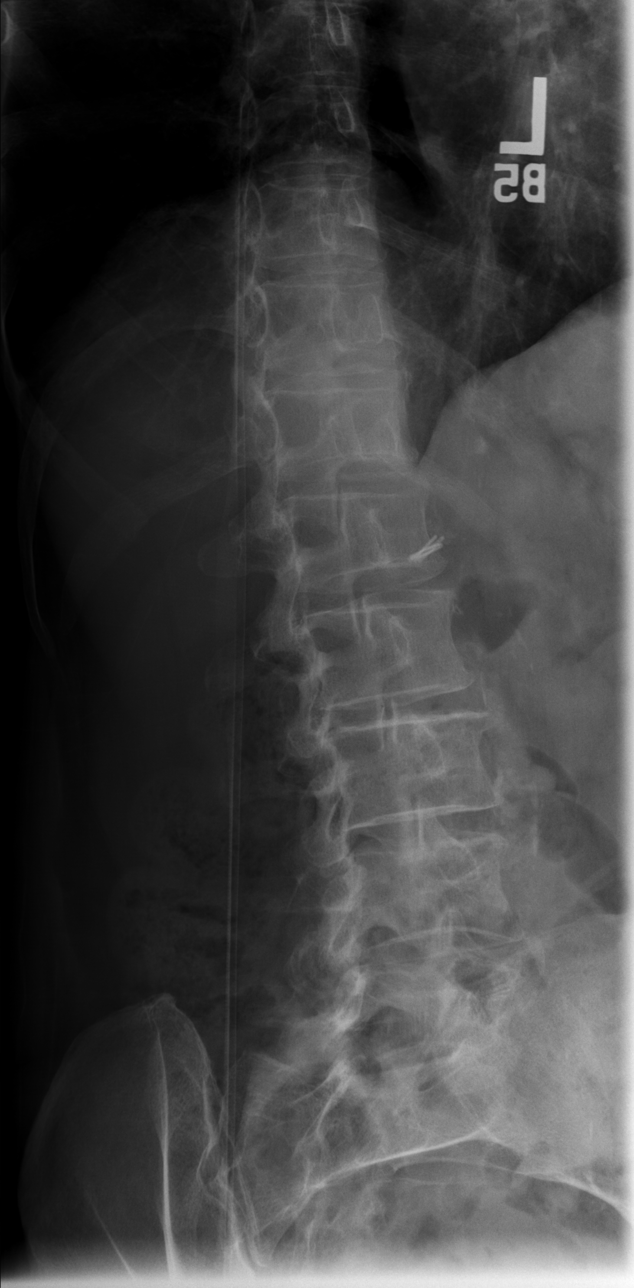

[t lumbar spine lat]
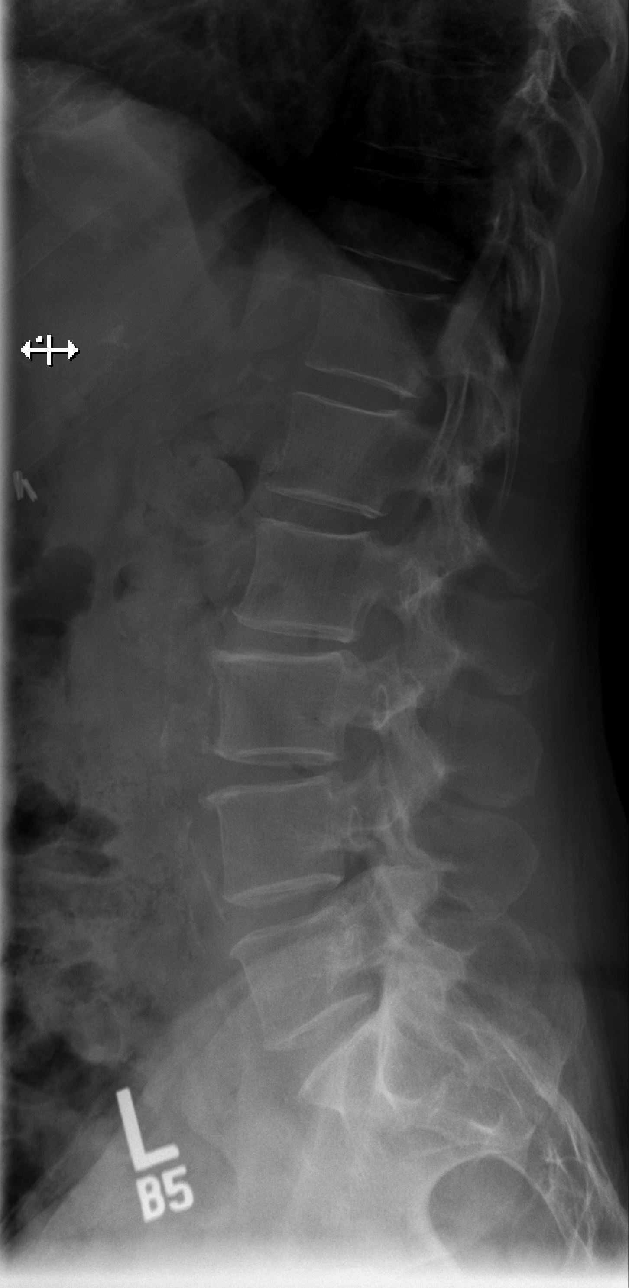

[t lumbar l-5 s-1 spot]
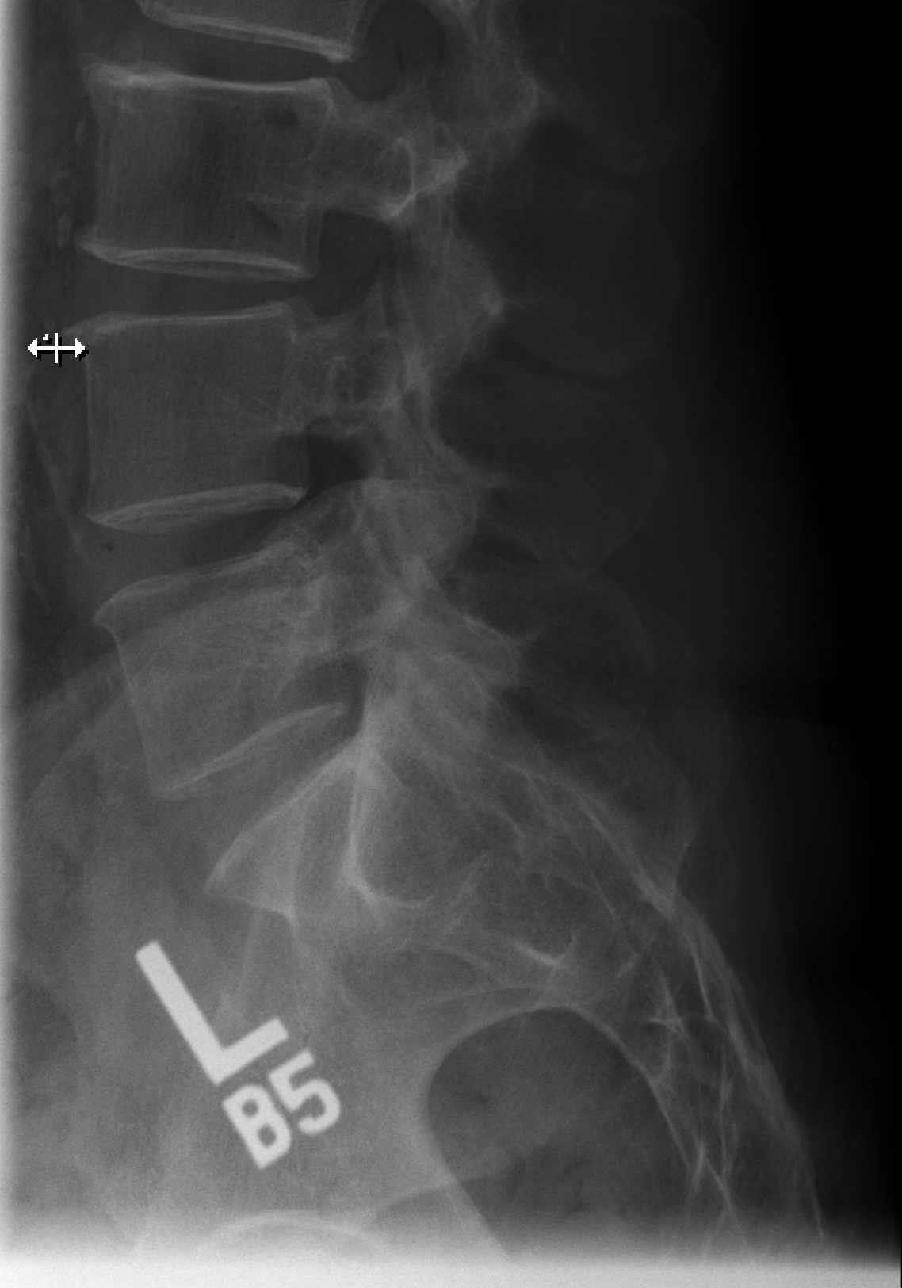

[5 of 5 positions shown; findings below may reference images not displayed]

FINDINGS: There is no evidence of lumbar spine fracture. Mild facet
arthropathy with joint space narrowing and sclerosis is noted at
L5-S1. Alignment is normal. Slight disc space narrowing noted at
L2-3 and L3-4. Aortic atherosclerosis is identified without
aneurysm. The patient is status post cholecystectomy
IMPRESSION: Slight disc space narrowing L2-3 and L3-4 with facet arthropathy at
L5-S1. No acute osseous abnormality.
